# Patient Record
Sex: Female | Born: 1950 | Race: White | Hispanic: No | Marital: Married | Smoking: Former smoker
Health system: Southern US, Community
[De-identification: ages and names within clinical notes are randomized; demographics above are authoritative.]

---

## 2018-10-20 ENCOUNTER — Emergency Department (HOSPITAL_COMMUNITY): Admission: EM | Admit: 2018-10-20 | Discharge: 2018-10-20 | Discharge status: Against Medical Advice | Payer: Self-pay

## 2018-10-20 NOTE — ED Notes (Signed)
Called pt with no response. Registration stated pt was seen leaving with family member.

## 2019-12-12 ENCOUNTER — Emergency Department (HOSPITAL_COMMUNITY): Payer: HRSA Program

## 2019-12-12 ENCOUNTER — Emergency Department (HOSPITAL_COMMUNITY)
Admission: EM | Admit: 2019-12-12 | Discharge: 2019-12-12 | Disposition: A | Discharge status: Home / Self Care | Payer: HRSA Program | Attending: Emergency Medicine | Admitting: Emergency Medicine

## 2019-12-12 ENCOUNTER — Encounter (HOSPITAL_COMMUNITY): Payer: Self-pay

## 2019-12-12 ENCOUNTER — Other Ambulatory Visit: Payer: Self-pay

## 2019-12-12 DIAGNOSIS — Z87891 Personal history of nicotine dependence: Secondary | ICD-10-CM | POA: Diagnosis not present

## 2019-12-12 DIAGNOSIS — U071 COVID-19: Secondary | ICD-10-CM

## 2019-12-12 DIAGNOSIS — R0602 Shortness of breath: Secondary | ICD-10-CM

## 2019-12-12 DIAGNOSIS — R05 Cough: Secondary | ICD-10-CM | POA: Diagnosis present

## 2019-12-12 LAB — SARS CORONAVIRUS 2 BY RT PCR (HOSPITAL ORDER, PERFORMED IN ~~LOC~~ HOSPITAL LAB): SARS Coronavirus 2: POSITIVE — AB

## 2019-12-12 MED ORDER — SODIUM CHLORIDE 0.9 % IV SOLN
1200.0000 mg | Freq: Once | INTRAVENOUS | Status: AC
  Administered 2019-12-12: 1200 mg via INTRAVENOUS
  Filled 2019-12-12: qty 10

## 2019-12-12 MED ORDER — DIPHENHYDRAMINE HCL 50 MG/ML IJ SOLN: 50.0000 mg | Freq: Once | INTRAMUSCULAR | Status: DC | PRN

## 2019-12-12 MED ORDER — ONDANSETRON 4 MG PO TBDP: 4.0000 mg | ORAL_TABLET | Freq: Three times a day (TID) | ORAL | 0 refills | Status: AC | PRN

## 2019-12-12 MED ORDER — EPINEPHRINE 0.3 MG/0.3ML IJ SOAJ: 0.3000 mg | Freq: Once | INTRAMUSCULAR | Status: DC | PRN

## 2019-12-12 MED ORDER — ALBUTEROL SULFATE HFA 108 (90 BASE) MCG/ACT IN AERS: 1.0000 | INHALATION_SPRAY | Freq: Four times a day (QID) | RESPIRATORY_TRACT | 0 refills | Status: AC | PRN

## 2019-12-12 MED ORDER — FAMOTIDINE IN NACL 20-0.9 MG/50ML-% IV SOLN: 20.0000 mg | Freq: Once | INTRAVENOUS | Status: DC | PRN

## 2019-12-12 MED ORDER — METHYLPREDNISOLONE SODIUM SUCC 125 MG IJ SOLR: 125.0000 mg | Freq: Once | INTRAMUSCULAR | Status: DC | PRN

## 2019-12-12 MED ORDER — SODIUM CHLORIDE 0.9 % IV SOLN: INTRAVENOUS | Status: DC | PRN

## 2019-12-12 MED ORDER — BENZONATATE 100 MG PO CAPS: 100.0000 mg | ORAL_CAPSULE | Freq: Three times a day (TID) | ORAL | 0 refills | Status: AC

## 2019-12-12 MED ORDER — ALBUTEROL SULFATE HFA 108 (90 BASE) MCG/ACT IN AERS: 2.0000 | INHALATION_SPRAY | Freq: Once | RESPIRATORY_TRACT | Status: DC | PRN

## 2019-12-12 NOTE — Discharge Instructions (Signed)
You were seen in the emergency room today with COVID-19 symptoms.  You receive the monoclonal antibodies which should help to keep your symptoms are getting significantly worse.  I am calling in a prescription for some Zofran to help with nausea and cough medication.  Please stay in quarantine for the next 14 days.  Follow with your primary care doctor by telehealth or phone call to update them on your symptoms and diagnosis.  Return to the emergency department any new or suddenly worsening symptoms.

## 2019-12-12 NOTE — ED Notes (Signed)
Date and time results received: 12/12/19 1720 (use smartphrase ".now" to insert current time)  Test: Covid Critical Value: Pos  Name of Provider Notified: Long

## 2019-12-12 NOTE — ED Provider Notes (Signed)
Emergency Department Provider Note   I have reviewed the triage vital signs and the nursing notes.   HISTORY  Chief Complaint Cough and Nausea   HPI Sue Kline is a 69 y.o. female with PMH reviewed below presents to the ED with COVID symptoms for the past 3-4 days.  Patient is not vaccinated and lives with her husband who has similar symptoms.  She has lost her sense of smell and taste.  She is having a productive cough and feels nasal congestion.  She is having some nausea and poor appetite but no vomiting.  Mild diarrhea.  Denies abdominal pain.  No radiation of symptoms or other modifying factors. Denies any CP or SOB.   History reviewed. No pertinent past medical history.  There are no problems to display for this patient.   History reviewed. No pertinent surgical history.  Allergies Patient has no known allergies.  History reviewed. No pertinent family history.  Social History Social History   Tobacco Use  . Smoking status: Former Smoker    Types: Cigarettes    Quit date: 12/05/2019    Years since quitting: 0.0  . Smokeless tobacco: Never Used  Substance Use Topics  . Alcohol use: Never  . Drug use: Never    Review of Systems  Constitutional: Positive fever/chills Eyes: No visual changes. ENT: Positive nasal congestion.  Cardiovascular: Denies chest pain. Respiratory: Denies shortness of breath. Positive cough.  Gastrointestinal: No abdominal pain. Positive nausea, no vomiting.  No diarrhea.  No constipation. Genitourinary: Negative for dysuria. Musculoskeletal: Negative for back pain. Skin: Negative for rash. Neurological: Negative for focal weakness or numbness. Positive HA.   10-point ROS otherwise negative.  ____________________________________________   PHYSICAL EXAM:  VITAL SIGNS: ED Triage Vitals  Enc Vitals Group     BP 12/12/19 1439 122/90     Pulse Rate 12/12/19 1439 (!) 50     Resp 12/12/19 1439 18     Temp 12/12/19 1439 98.1 F  (36.7 C)     Temp Source 12/12/19 1439 Oral     SpO2 12/12/19 1439 96 %     Weight 12/12/19 1441 127 lb (57.6 kg)     Height 12/12/19 1441 5\' 5"  (1.651 m)   Constitutional: Alert and oriented. Well appearing and in no acute distress. Eyes: Conjunctivae are normal.  Head: Atraumatic. Nose: Positive congestion/rhinnorhea. Mouth/Throat: Mucous membranes are moist.  Neck: No stridor.   Cardiovascular: Normal rate, regular rhythm. Good peripheral circulation. Grossly normal heart sounds.   Respiratory: Normal respiratory effort.  No retractions. Lungs CTAB. Gastrointestinal: Soft and nontender. No distention.  Musculoskeletal: No gross deformities of extremities. Neurologic:  Normal speech and language. Skin:  Skin is warm, dry and intact. No rash noted.   ____________________________________________   LABS (all labs ordered are listed, but only abnormal results are displayed)  Labs Reviewed  SARS CORONAVIRUS 2 BY RT PCR (HOSPITAL ORDER, PERFORMED IN [facility]) - Abnormal; Notable for the following components:      Result Value   SARS Coronavirus 2 POSITIVE (*)    All other components within normal limits   ____________________________________________  EKG   EKG Interpretation  Date/Time:  Wednesday December 12 2019 14:43:03 EDT Ventricular Rate:  89 PR Interval:  148 QRS Duration: 64 QT Interval:  332 QTC Calculation: 403 R Axis:   74 Text Interpretation: Normal sinus rhythm Low voltage QRS T wave abnormality, consider inferior ischemia Abnormal ECG No STEMI Confirmed by [id number] [phone]) on 12/12/2019  6:06:10 PM       ____________________________________________  RADIOLOGY  DG Chest Port 1 View  Result Date: 12/12/2019 CLINICAL DATA:  Cough a nausea.  Former smoker. EXAM: PORTABLE CHEST 1 VIEW COMPARISON:  None. FINDINGS: The heart size and mediastinal contours are within normal limits. Flattening of the hemidiaphragms consistent with emphysema.  Biapical pleural/pulmonary scarring. Right costophrenic angle opacity. No pulmonary edema. No pleural effusion. No pneumothorax. No acute osseous abnormality. IMPRESSION: Right costophrenic angle focal opacity may represent atelectasis versus consolidation. Electronically Signed   By: Tish Frederickson M.D.   On: 12/12/2019 15:25    ____________________________________________   PROCEDURES  Procedure(s) performed:   Procedures  None  ____________________________________________   INITIAL IMPRESSION / ASSESSMENT AND PLAN / ED COURSE  Pertinent labs & imaging results that were available during my care of the patient were reviewed by me and considered in my medical decision making (see chart for details).   Patient presents to the emergency department with COVID-19-like symptoms over the past 3 to 4 days.  She did test positive for Covid on PCR here.  She is hemodynamically stable and well-appearing.  She is speaking in complete sentences and has clear lungs.  Oxygen is 96% on room air.  Given the patient's age she does meet criteria for monoclonal antibody infusion.  I had a detailed discussion with her regarding the risks and benefits of this emergency authorization use infusion.  Patient is agreeable to move ahead with monoclonal antibodies.   Sue Kline was evaluated in Emergency Department on 12/12/2019 for the symptoms described in the history of present illness. She was evaluated in the context of the global COVID-19 pandemic, which necessitated consideration that the patient might be at risk for infection with the SARS-CoV-2 virus that causes COVID-19. Institutional protocols and algorithms that pertain to the evaluation of patients at risk for COVID-19 are in a state of rapid change based on information released by regulatory bodies including the CDC and federal and state organizations. These policies and algorithms were followed during the patient's care in the  ED.  ____________________________________________  FINAL CLINICAL IMPRESSION(S) / ED DIAGNOSES  Final diagnoses:  SOB (shortness of breath)     MEDICATIONS GIVEN DURING THIS VISIT:  Medications  casirivimab-imdevimab (REGEN-COV) 1,200 mg in sodium chloride 0.9 % 110 mL IVPB (has no administration in time range)  0.9 %  sodium chloride infusion (has no administration in time range)  diphenhydrAMINE (BENADRYL) injection 50 mg (has no administration in time range)  famotidine (PEPCID) IVPB 20 mg premix (has no administration in time range)  methylPREDNISolone sodium succinate (SOLU-MEDROL) 125 mg/2 mL injection 125 mg (has no administration in time range)  albuterol (VENTOLIN HFA) 108 (90 Base) MCG/ACT inhaler 2 puff (has no administration in time range)  EPINEPHrine (EPI-PEN) injection 0.3 mg (has no administration in time range)    Note:  This document was prepared using Dragon voice recognition software and may include unintentional dictation errors.  Alona Bene, MD, [facility] Emergency [facility]    Quintara Bost, Arlyss Repress, MD 12/15/19 [phone]

## 2019-12-12 NOTE — ED Triage Notes (Signed)
Pt to er, pt states that for the past few days she has had a cough and some nausea.  Pt states that it is a productive cough.  Denies exposure.  Denies getting the vaccine.

## 2021-02-16 IMAGING — DX DG CHEST 1V PORT
1 series · 1 of 1 positions shown · non-contrast
Comparison: None.

CLINICAL DATA: Cough a nausea.  Former smoker.

EXAM:
PORTABLE CHEST 1 VIEW

[chest ap]
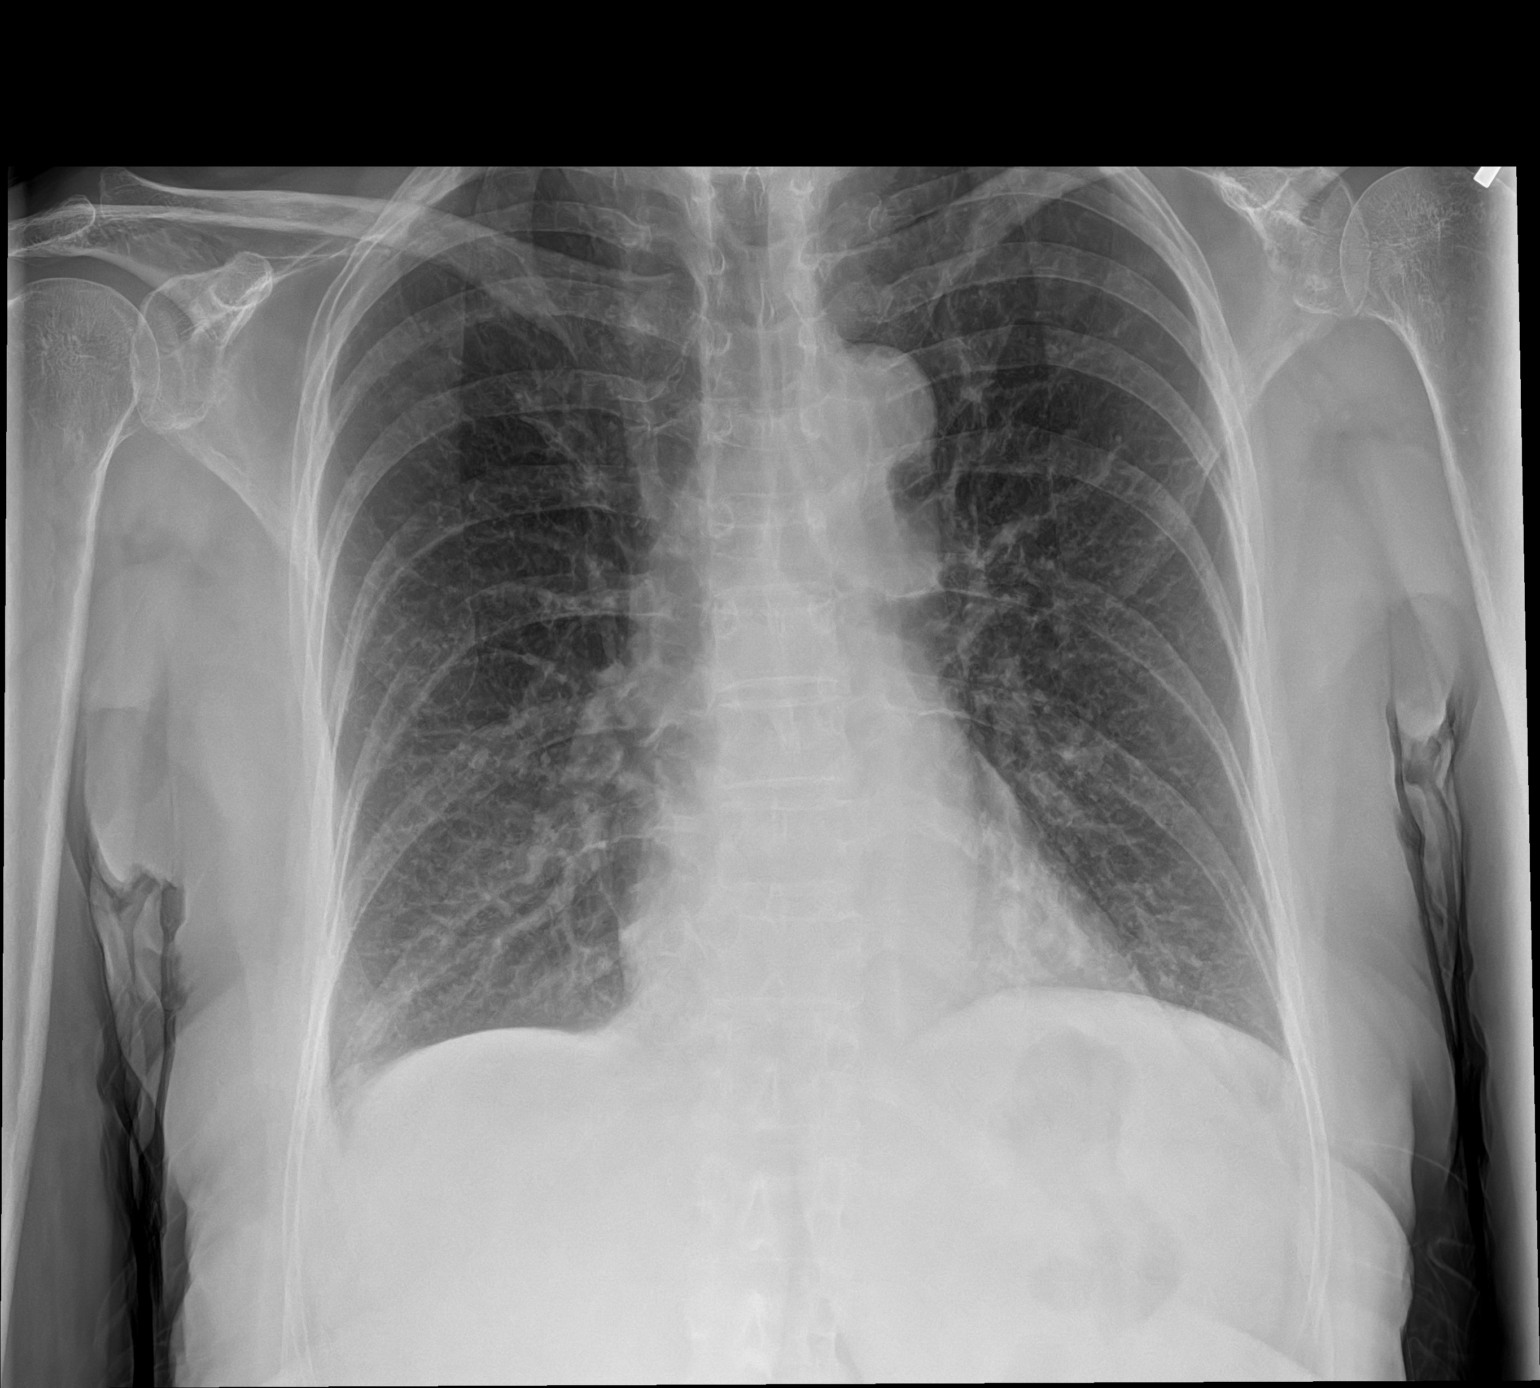

[1 of 1 positions shown; findings below may reference images not displayed]

FINDINGS: The heart size and mediastinal contours are within normal limits.

Flattening of the hemidiaphragms consistent with emphysema. Biapical
pleural/pulmonary scarring. Right costophrenic angle opacity. No
pulmonary edema. No pleural effusion. No pneumothorax.

No acute osseous abnormality.
IMPRESSION: Right costophrenic angle focal opacity may represent atelectasis
versus consolidation.
